# Patient Record
Sex: Male | Born: 2005
Health system: Southern US, Community
[De-identification: ages and names within clinical notes are randomized; demographics above are authoritative.]

## PROBLEM LIST (undated history)

## (undated) DIAGNOSIS — I456 Pre-excitation syndrome: Secondary | ICD-10-CM

## (undated) DIAGNOSIS — J45909 Unspecified asthma, uncomplicated: Secondary | ICD-10-CM

## (undated) DIAGNOSIS — B2790 Infectious mononucleosis, unspecified without complication: Secondary | ICD-10-CM

## (undated) HISTORY — PX: ABLATION: SHX5711

---
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
## 2023-05-28 ENCOUNTER — Emergency Department (HOSPITAL_COMMUNITY): Payer: BLUE CROSS/BLUE SHIELD

## 2023-05-28 ENCOUNTER — Emergency Department (HOSPITAL_COMMUNITY)
Admission: EM | Admit: 2023-05-28 | Discharge: 2023-05-28 | Discharge status: Against Medical Advice | Payer: BLUE CROSS/BLUE SHIELD | Attending: Emergency Medicine | Admitting: Emergency Medicine

## 2023-05-28 ENCOUNTER — Encounter (HOSPITAL_COMMUNITY): Payer: Self-pay

## 2023-05-28 ENCOUNTER — Other Ambulatory Visit: Payer: Self-pay

## 2023-05-28 DIAGNOSIS — M79602 Pain in left arm: Secondary | ICD-10-CM | POA: Diagnosis not present

## 2023-05-28 DIAGNOSIS — M546 Pain in thoracic spine: Secondary | ICD-10-CM | POA: Diagnosis not present

## 2023-05-28 DIAGNOSIS — Y9323 Activity, snow (alpine) (downhill) skiing, snow boarding, sledding, tobogganing and snow tubing: Secondary | ICD-10-CM | POA: Diagnosis not present

## 2023-05-28 DIAGNOSIS — R0781 Pleurodynia: Secondary | ICD-10-CM | POA: Diagnosis present

## 2023-05-28 DIAGNOSIS — M25522 Pain in left elbow: Secondary | ICD-10-CM | POA: Insufficient documentation

## 2023-05-28 DIAGNOSIS — Z5321 Procedure and treatment not carried out due to patient leaving prior to being seen by health care provider: Secondary | ICD-10-CM | POA: Insufficient documentation

## 2023-05-28 NOTE — ED Triage Notes (Signed)
Pt reports he was snowboarding today and fell and then someone landed on him. Pt reports positive LOC and denies headache, vision changes or emesis. Pt reports upper back/rib pain and left arm pain around elbow.

## 2023-05-29 ENCOUNTER — Emergency Department (HOSPITAL_BASED_OUTPATIENT_CLINIC_OR_DEPARTMENT_OTHER)
Admission: EM | Admit: 2023-05-29 | Discharge: 2023-05-29 | Disposition: A | Discharge status: Home / Self Care | Payer: BLUE CROSS/BLUE SHIELD | Attending: Emergency Medicine | Admitting: Emergency Medicine

## 2023-05-29 ENCOUNTER — Emergency Department (HOSPITAL_BASED_OUTPATIENT_CLINIC_OR_DEPARTMENT_OTHER): Payer: BLUE CROSS/BLUE SHIELD

## 2023-05-29 ENCOUNTER — Other Ambulatory Visit: Payer: Self-pay

## 2023-05-29 ENCOUNTER — Encounter (HOSPITAL_BASED_OUTPATIENT_CLINIC_OR_DEPARTMENT_OTHER): Payer: Self-pay | Admitting: *Deleted

## 2023-05-29 DIAGNOSIS — Y9323 Activity, snow (alpine) (downhill) skiing, snow boarding, sledding, tobogganing and snow tubing: Secondary | ICD-10-CM | POA: Diagnosis not present

## 2023-05-29 DIAGNOSIS — M7989 Other specified soft tissue disorders: Secondary | ICD-10-CM | POA: Insufficient documentation

## 2023-05-29 DIAGNOSIS — R0789 Other chest pain: Secondary | ICD-10-CM | POA: Insufficient documentation

## 2023-05-29 DIAGNOSIS — S59902A Unspecified injury of left elbow, initial encounter: Secondary | ICD-10-CM | POA: Diagnosis present

## 2023-05-29 MED ORDER — ACETAMINOPHEN 325 MG PO TABS
650.0000 mg | ORAL_TABLET | Freq: Once | ORAL | Status: AC
  Administered 2023-05-29: 650 mg via ORAL
  Filled 2023-05-29: qty 2

## 2023-05-29 MED ORDER — OXYCODONE HCL 5 MG PO TABS: 5.0000 mg | ORAL_TABLET | ORAL | 0 refills | Status: DC | PRN

## 2023-05-29 MED ORDER — IBUPROFEN 400 MG PO TABS
600.0000 mg | ORAL_TABLET | Freq: Once | ORAL | Status: AC
  Administered 2023-05-29: 600 mg via ORAL
  Filled 2023-05-29: qty 1

## 2023-05-29 NOTE — ED Provider Notes (Signed)
[place] EMERGENCY DEPARTMENT AT [facility] Provider Note   CSN: [id number] Arrival date & time: 05/29/23  0230     History  Chief Complaint  Patient presents with   Arm Injury    Ian Gutierrez is a 17 y.o. male.  The history is provided by the patient.  Arm Injury He injured his left arm 2 days ago while snowboarding.  Somebody ran into him and his left arm was pinned under him.  He also is complaining of some mild pain in the left chest wall, especially if he takes a deep breath.  He went to [facility] emergency yesterday where x-rays were taken of the chest and left elbow, but he left before being seen by provider because of the long wait.  He continues to have pain and states he is unable to sleep in spite of taking acetaminophen.   Home Medications Prior to Admission medications   Medication Sig Start Date End Date Taking? Authorizing Provider  acetaminophen (TYLENOL) 500 MG tablet Take 2 tablets (1,000 mg total) by mouth every 6 (six) hours. 09/19/21   Madison Hickman, MD  albuterol (VENTOLIN HFA) 108 (90 Base) MCG/ACT inhaler Inhale 2 puffs into the lungs every 4 (four) hours as needed for wheezing or shortness of breath. 09/19/21   Madison Hickman, MD  ibuprofen (ADVIL) 600 MG tablet Take 1 tablet (600 mg total) by mouth every 6 (six) hours as needed for fever or mild pain. 09/19/21   Madison Hickman, MD  predniSONE (DELTASONE) 20 MG tablet Take 2 tablets (40 mg total) by mouth daily with breakfast for two more doses 09/20/21   Madison Hickman, MD      Allergies    Sulfa antibiotics    Review of Systems   Review of Systems  All other systems reviewed and are negative.   Physical Exam Updated Vital Signs BP (!) 140/74   Pulse 83   Temp 98.5 F (36.9 C)   Resp 16   SpO2 100%  Physical Exam Vitals and nursing note reviewed.   17 year old male, resting comfortably and in no acute distress. Vital signs are normal. Oxygen saturation is 100%,  which is normal. Head is normocephalic and atraumatic. PERRLA, EOMI.  Neck is nontender. Back is nontender and there is no CVA tenderness. Lungs are clear without rales, wheezes, or rhonchi. Chest is nontender. Heart has regular rate and rhythm without murmur. Abdomen is soft, flat, nontender. Extremities: There is soft tissue swelling around the left elbow with tenderness diffusely but maximum tenderness in the supracondylar region.  There is mild pain on pronation and supination, moderate to severe pain with flexion.  Distal neurovascular exam is intact with strong radial pulse, prompt capillary refill, normal sensation, normal use of distal musculature.  Full range of motion all other joints without pain. Skin is warm and dry without rash. Neurologic: Mental status is normal, cranial nerves are intact, restricted movement of left arm from pain, otherwise moves all extremities equally.  ED Results / Procedures / Treatments   Labs (all labs ordered are listed, but only abnormal results are displayed) Labs Reviewed - No data to display  EKG None  Radiology CT Elbow Left Wo Contrast Result Date: 05/29/2023 CLINICAL DATA:  Elbow trauma.  Recent snowboarding accident. EXAM: CT OF THE UPPER LEFT EXTREMITY WITHOUT CONTRAST TECHNIQUE: Multidetector CT imaging of the upper left extremity was performed according to the standard protocol. RADIATION DOSE REDUCTION: This exam was performed according to the departmental dose-optimization  program which includes automated exposure control, adjustment of the mA and/or kV according to patient size and/or use of iterative reconstruction technique. COMPARISON:  Radiograph yesterday. FINDINGS: Bones/Joint/Cartilage No acute fracture. No dislocation. Joint spaces are normal. No joint effusion or lipohemarthrosis. Ligaments Suboptimally assessed by CT. Muscles and Tendons Triceps tendon is poorly defined. Heterogeneous low-density within the distal triceps  musculature. Soft tissues Posterior subcutaneous edema. No discrete fluid collection. No radiopaque foreign body or soft tissue gas. IMPRESSION: 1. No acute fracture or dislocation. 2. Triceps tendon is poorly defined. Heterogeneous low-density within the distal triceps musculature. Findings are suspicious for triceps tendon tear or rupture. This would be better assessed with MRI as clinically indicated. 3. Posterior subcutaneous edema. Electronically Signed   By: Narda Rutherford M.D.   On: 05/29/2023 03:59   DG Elbow Complete Left Result Date: 05/28/2023 CLINICAL DATA:  Larey Seat snowboarding now with right upper chest pain, left elbow injury. EXAM: LEFT ELBOW - COMPLETE 3+ VIEW; CHEST - 2 VIEW COMPARISON:  PA chest 09/18/2021.  Left elbow films 03/30/2017. FINDINGS: PA and lateral chest: The lungs are clear. The cardiomediastinal silhouette and vasculature are normal. There is no substantial pleural effusion no visible pneumothorax. Thoracic cage appears intact.  Slight thoracic dextroscoliosis. Compare: Interval resolution of the prior bilateral upper lobe infiltrates. Left elbow, 4 projections: The patient could not assume the position for a true lateral view. The presence of a joint effusion is not evaluated for. There is normal bone mineralization. No displaced fracture is seen or dislocation. No evidence of arthropathy or other focal bone abnormality. There is moderate dorsal soft tissue swelling. IMPRESSION: 1. No evidence of acute chest disease. 2. Moderate dorsal left elbow soft tissue swelling. No displaced fracture is seen. The presence of a joint effusion is not evaluated for due to positioning on the attempted lateral view. Electronically Signed   By: Almira Bar M.D.   On: 05/28/2023 02:37   DG Chest 2 View Result Date: 05/28/2023 CLINICAL DATA:  Larey Seat snowboarding now with right upper chest pain, left elbow injury. EXAM: LEFT ELBOW - COMPLETE 3+ VIEW; CHEST - 2 VIEW COMPARISON:  PA chest  09/18/2021.  Left elbow films 03/30/2017. FINDINGS: PA and lateral chest: The lungs are clear. The cardiomediastinal silhouette and vasculature are normal. There is no substantial pleural effusion no visible pneumothorax. Thoracic cage appears intact.  Slight thoracic dextroscoliosis. Compare: Interval resolution of the prior bilateral upper lobe infiltrates. Left elbow, 4 projections: The patient could not assume the position for a true lateral view. The presence of a joint effusion is not evaluated for. There is normal bone mineralization. No displaced fracture is seen or dislocation. No evidence of arthropathy or other focal bone abnormality. There is moderate dorsal soft tissue swelling. IMPRESSION: 1. No evidence of acute chest disease. 2. Moderate dorsal left elbow soft tissue swelling. No displaced fracture is seen. The presence of a joint effusion is not evaluated for due to positioning on the attempted lateral view. Electronically Signed   By: Almira Bar M.D.   On: 05/28/2023 02:37    Procedures .Ortho Injury Treatment  Date/Time: 05/29/2023 4:15 AM  Performed by: Dione Booze, MD Authorized by: Dione Booze, MD   Consent:    Consent obtained:  Verbal   Consent given by:  Patient   Risks discussed:  Restricted joint movement and stiffness   Alternatives discussed:  No treatmentInjury location: elbow Location details: left elbow Injury type: soft tissue Pre-procedure neurovascular assessment: neurovascularly intact Pre-procedure distal  perfusion: normal Pre-procedure neurological function: normal Pre-procedure range of motion: reduced  Anesthesia: Local anesthesia used: no  Patient sedated: NoImmobilization: sling Splint Applied by: ED Nurse Post-procedure distal perfusion: normal Post-procedure neurological function: normal Post-procedure range of motion: unchanged       Medications Ordered in ED Medications  ibuprofen (ADVIL) tablet 600 mg (600 mg Oral Given  05/29/23 0304)  acetaminophen (TYLENOL) tablet 650 mg (650 mg Oral Given 05/29/23 0304)    ED Course/ Medical Decision Making/ A&P                                 Medical Decision Making Amount and/or Complexity of Data Reviewed Radiology: ordered.  Risk OTC drugs. Prescription drug management.   Left elbow injury concerning for fracture.  Exam is especially concerning for possible supracondylar fracture.  X-rays obtained yesterday showed no evidence of any chest injury, significant swelling of the left elbow with no definite fracture seen.  I have independently viewed those images, and agree with radiologist's interpretation.  I am ordering CT scan of his elbow to evaluate for possible occult fracture.  CT scan shows no fracture, but suggest that there may be an injury to the triceps tendon.  I have ordered a sling, recommended he apply ice several times a day.  I have recommended that he use over-the-counter NSAIDs and acetaminophen as needed for pain, I am discharging him with a prescription for small number of oxycodone tablets to take for breakthrough pain.  I am referring him to orthopedics for follow-up.  Final Clinical Impression(s) / ED Diagnoses Final diagnoses:  Elbow injury, left, initial encounter    Rx / DC Orders ED Discharge Orders          Ordered    oxyCODONE (ROXICODONE) 5 MG immediate release tablet  Every 4 hours PRN        05/29/23 0412              Dione Booze, MD 05/29/23 [phone]

## 2023-05-29 NOTE — ED Triage Notes (Signed)
Pt had a snowboarding accident yesterday and was seen at [facility] but left due to wait time. L elbow noted toe be swollen and red, limited ROM with worsening pain.   Pt reports a snowboarder made a jump and landed on him, causing LOC, having chest pain on inspiration.

## 2023-05-29 NOTE — Discharge Instructions (Signed)
Your CT scan suggest that there may be a tear of the triceps tendon.  You will need to follow-up with the orthopedic doctor for further evaluation of this.  Call on Monday morning for an appointment.  In the meantime, wear the sling as needed.    Apply ice for 30 minutes at a time, 4 times a day.  Take ibuprofen and/or acetaminophen as needed for pain.  For pain not relieved by the combination of ibuprofen and acetaminophen, add oxycodone.

## 2024-01-23 IMAGING — DX DG CHEST 2V
3 series · 3 of 3 positions shown · non-contrast
Comparison: Outpatient radiographs of earlier today at [DATE] p.m.

CLINICAL DATA: Cough. Shortness of breath. 15 pound weight loss
over last 2 weeks. Strep throat and known pneumonia.

EXAM:
CHEST - 2 VIEW

[chest lat]
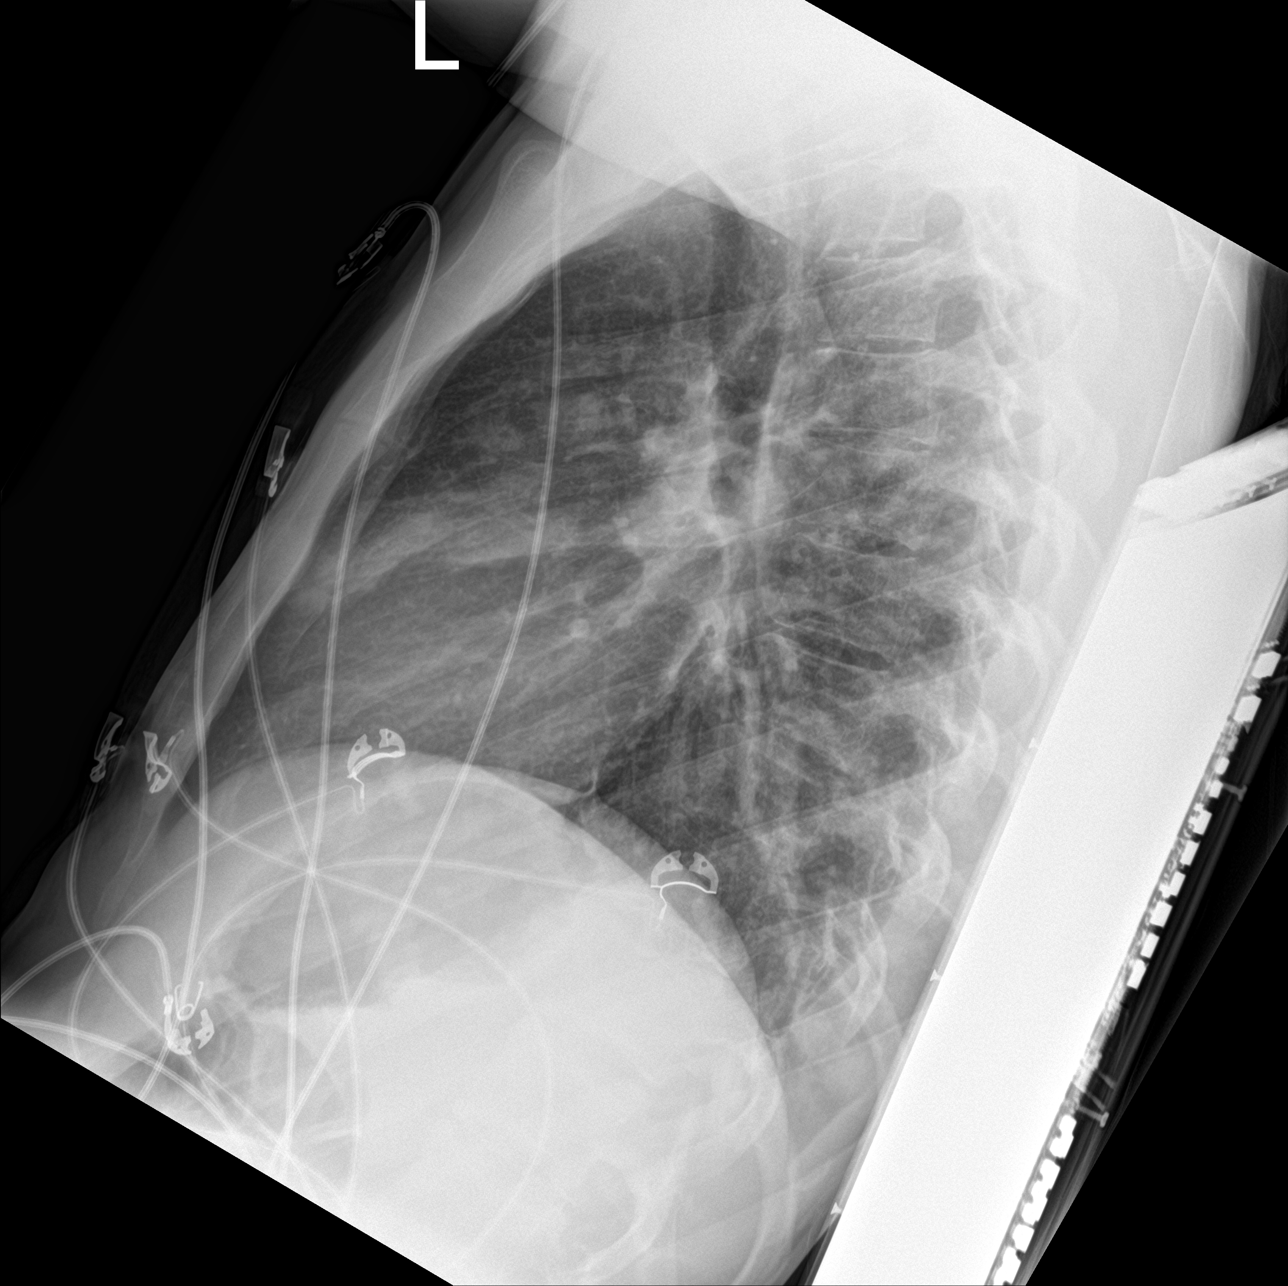

[chest ap (1 of 2)]
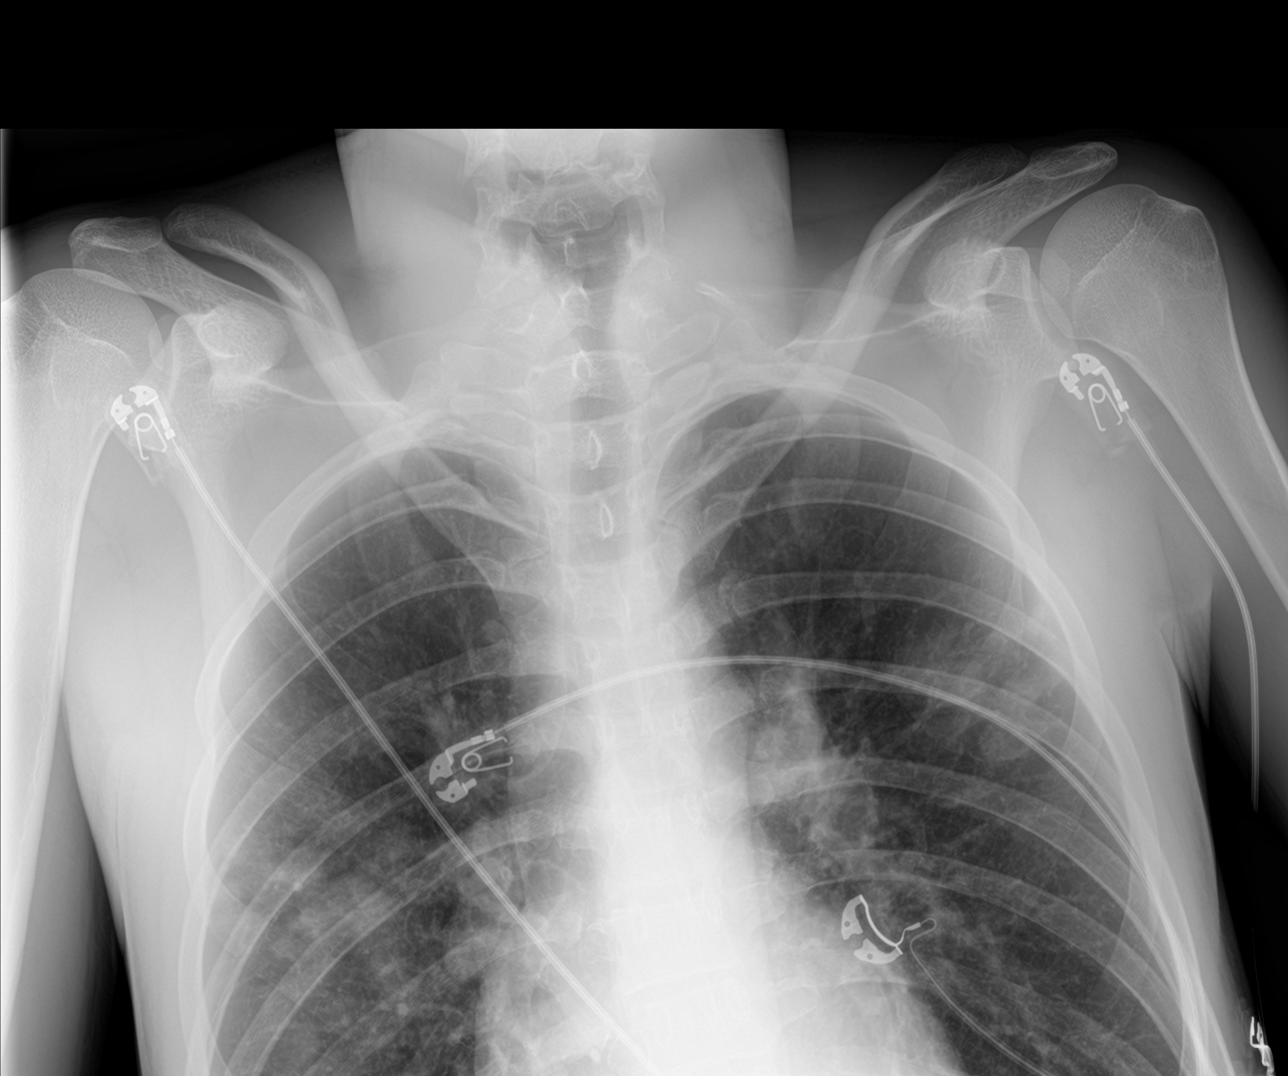

[chest ap (2 of 2)]
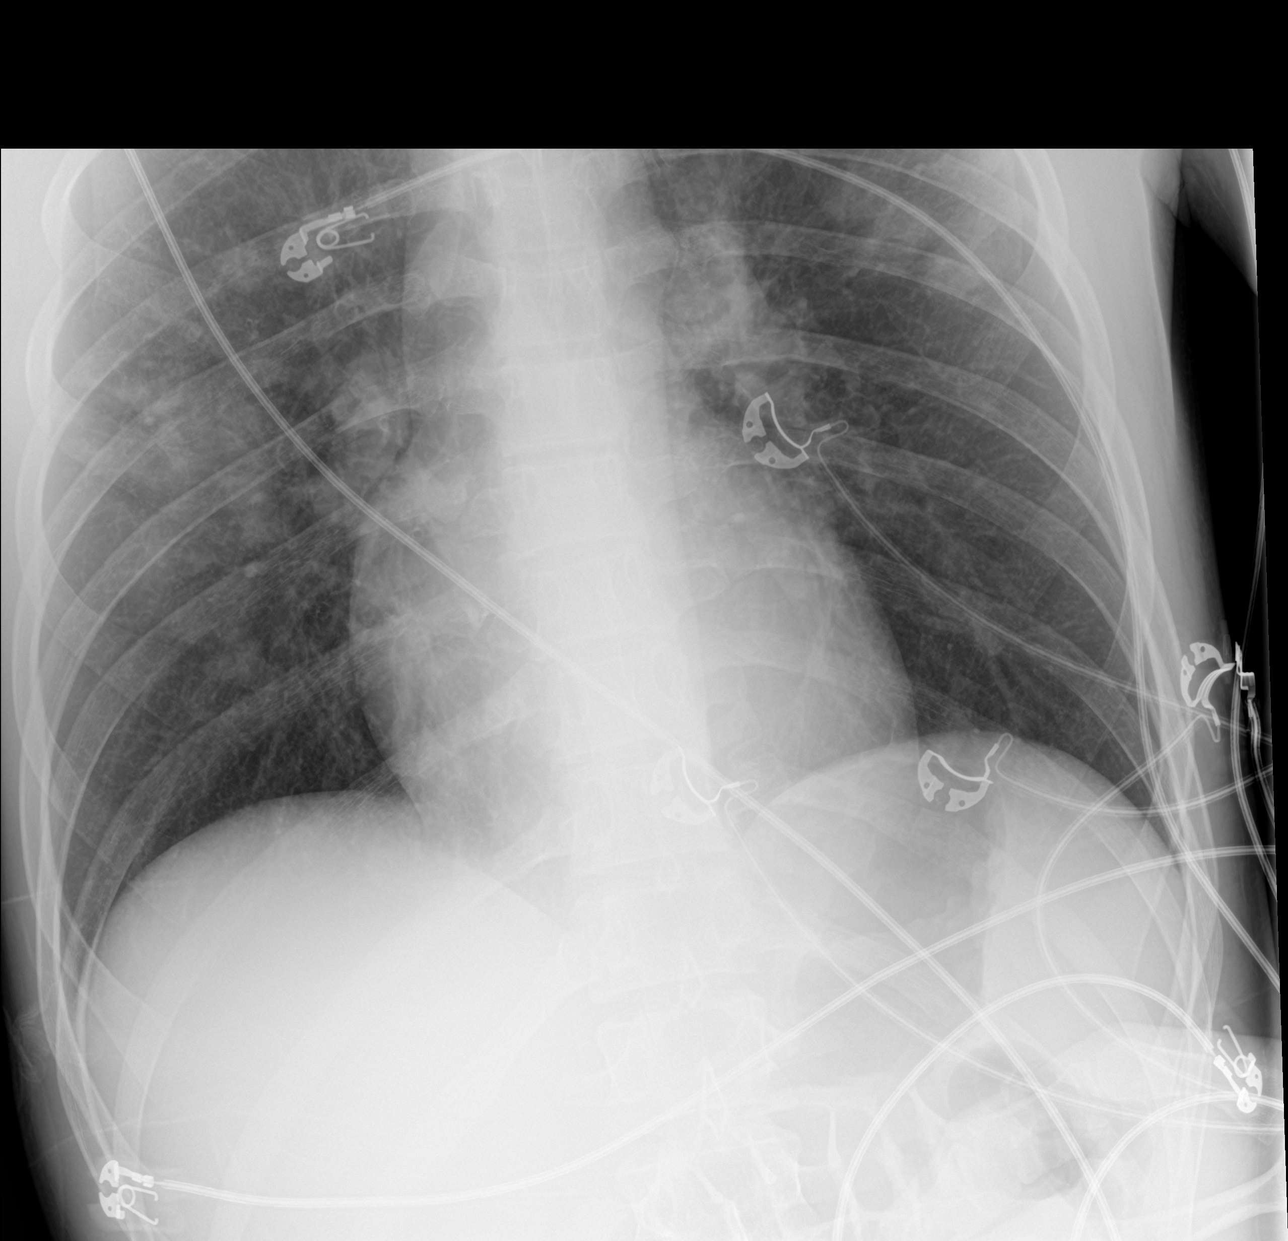

[3 of 3 positions shown; findings below may reference images not displayed]

FINDINGS: [DATE] p.m. Multiple leads and wires project over the chest on the
frontal radiograph. Midline trachea. Normal heart size and
mediastinal contours. No pleural effusion or pneumothorax. Patchy
bilateral upper lobe airspace disease is not significantly changed.
IMPRESSION: No significant change in bilateral upper lobe airspace disease, most
consistent with pneumonia.

## 2024-01-24 IMAGING — CR DG CHEST 2V SAME DAY
2 series · 2 of 2 positions shown · non-contrast
Comparison: Chest x-ray 09/17/2021.

CLINICAL DATA: Chest pain.

EXAM:
CHEST - 2 VIEW SAME DAY

[chest pa]
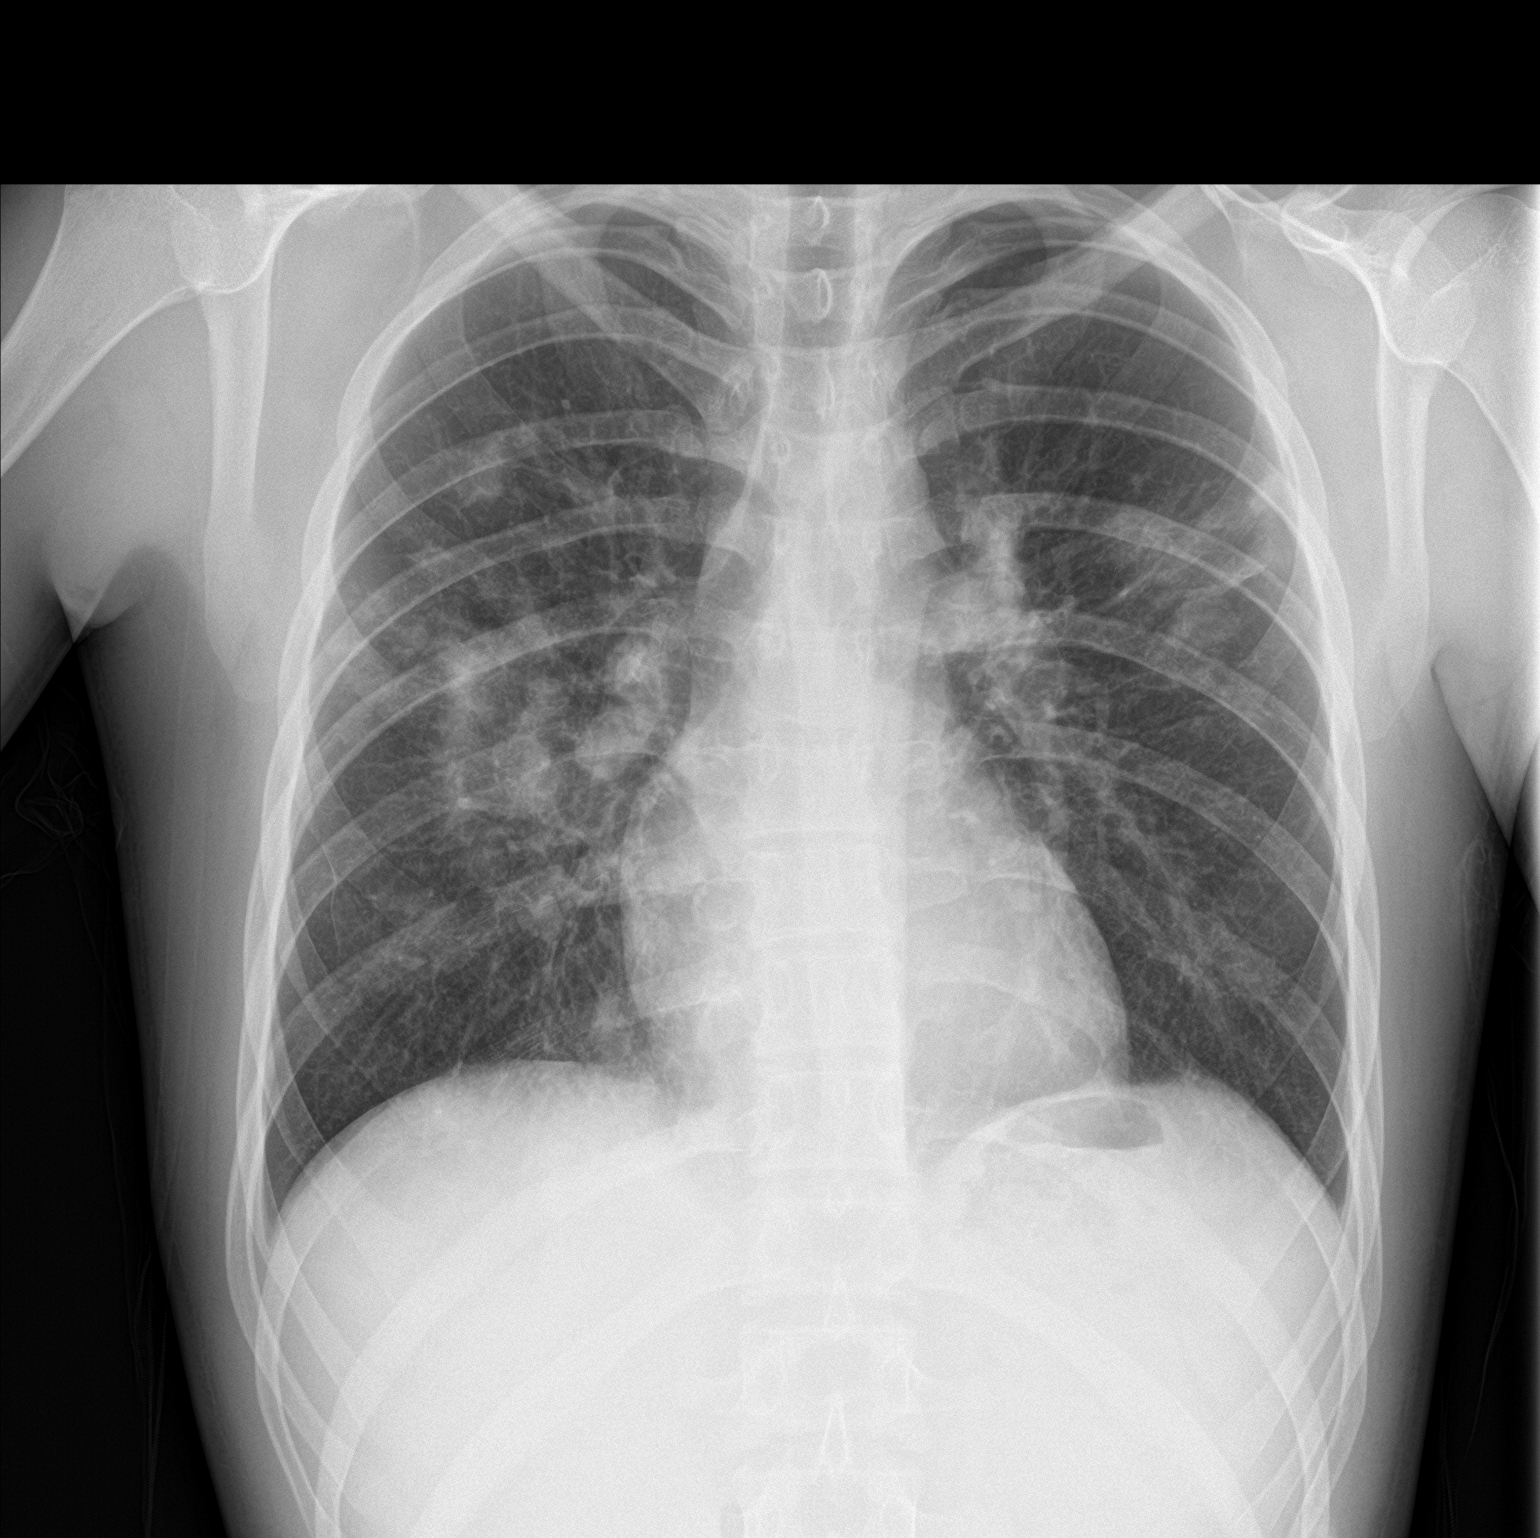

[chest lat]
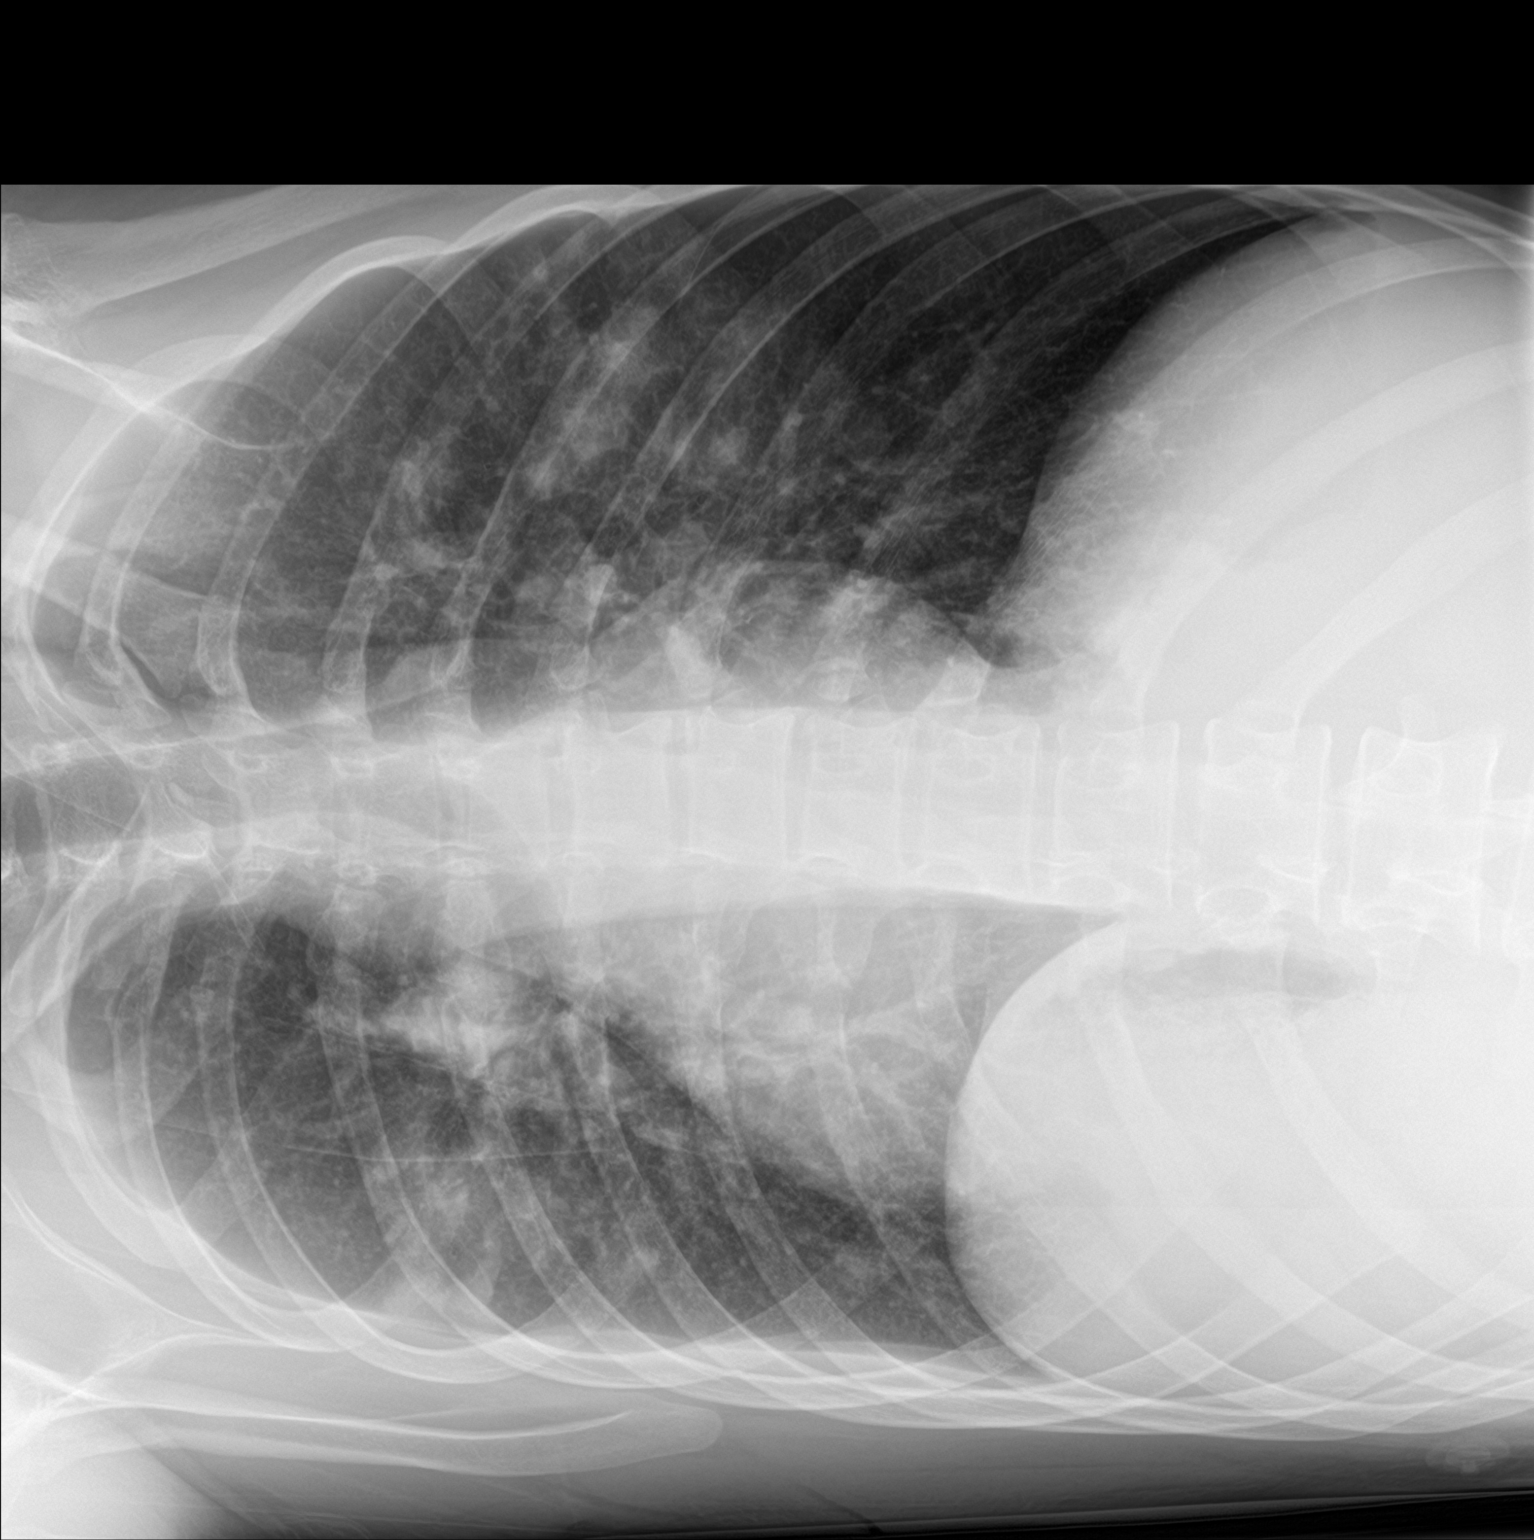

[2 of 2 positions shown; findings below may reference images not displayed]

FINDINGS: Patchy multifocal airspace opacities in the mid lungs are again
seen, mildly increased on the right. There is no pleural effusion or
pneumothorax visualized. Cardiomediastinal silhouette is within
normal limits. No acute fractures are seen.
IMPRESSION: 1. Bilateral multifocal airspace disease, increasing on the right.
Findings are worrisome for pneumonia.

## 2024-03-23 ENCOUNTER — Encounter (HOSPITAL_BASED_OUTPATIENT_CLINIC_OR_DEPARTMENT_OTHER): Payer: Self-pay | Admitting: Emergency Medicine

## 2024-03-23 ENCOUNTER — Emergency Department (HOSPITAL_BASED_OUTPATIENT_CLINIC_OR_DEPARTMENT_OTHER)
Admission: EM | Admit: 2024-03-23 | Discharge: 2024-03-23 | Disposition: A | Discharge status: Home / Self Care | Attending: Emergency Medicine | Admitting: Emergency Medicine

## 2024-03-23 ENCOUNTER — Emergency Department (HOSPITAL_BASED_OUTPATIENT_CLINIC_OR_DEPARTMENT_OTHER): Admitting: Radiology

## 2024-03-23 ENCOUNTER — Other Ambulatory Visit: Payer: Self-pay

## 2024-03-23 DIAGNOSIS — X501XXA Overexertion from prolonged static or awkward postures, initial encounter: Secondary | ICD-10-CM | POA: Diagnosis not present

## 2024-03-23 DIAGNOSIS — M25572 Pain in left ankle and joints of left foot: Secondary | ICD-10-CM | POA: Diagnosis present

## 2024-03-23 DIAGNOSIS — M25472 Effusion, left ankle: Secondary | ICD-10-CM | POA: Diagnosis not present

## 2024-03-23 NOTE — ED Provider Notes (Signed)
 [place] EMERGENCY DEPARTMENT AT [facility] Provider Note   CSN: [id number] Arrival date & time: 03/23/24  1523    Patient presents with: Ankle Pain   Ian Gutierrez is a 18 y.o. male here for evaluation of left ankle pain and swelling.  Jumped up and went down and felt a pop to the ankle.  Has had pain and swelling to the area.  Hurts in every bears weight on the area.  He was wearing a boot at home and crutches.  He denies any numbness, weakness.  No redness, lacerations.   HPI     Prior to Admission medications   Medication Sig Start Date End Date Taking? Authorizing Provider  acetaminophen  (TYLENOL ) 500 MG tablet Take 2 tablets (1,000 mg total) by mouth every 6 (six) hours. 09/19/21   Ian Iha, MD  albuterol  (VENTOLIN  HFA) [phone] Base) MCG/ACT inhaler Inhale 2 puffs into the lungs every 4 (four) hours as needed for wheezing or shortness of breath. 09/19/21   Ian Iha, MD  ibuprofen  (ADVIL ) 600 MG tablet Take 1 tablet (600 mg total) by mouth every 6 (six) hours as needed for fever or mild pain. 09/19/21   Ian Iha, MD  oxyCODONE  (ROXICODONE ) 5 MG immediate release tablet Take 1 tablet (5 mg total) by mouth every 4 (four) hours as needed for severe pain (pain score 7-10). 05/29/23   Ian Lenis, MD    Allergies: Sulfa antibiotics    Review of Systems  Constitutional: Negative.   HENT: Negative.    Respiratory: Negative.    Cardiovascular: Negative.   Gastrointestinal: Negative.   Genitourinary: Negative.   Musculoskeletal:        Left ankle pain swelling  Skin: Negative.   Neurological: Negative.   All other systems reviewed and are negative.   Updated Vital Signs BP 124/71 (BP Location: Right Arm)   Pulse 66   Temp 98.1 F (36.7 C)   Resp 20   Ht 6' 1 (1.854 m)   Wt 81.6 kg   SpO2 99%   BMI 23.75 kg/m   Physical Exam Vitals and nursing note reviewed.  Constitutional:      General: He is not in acute distress.     Appearance: He is well-developed. He is not ill-appearing, toxic-appearing or diaphoretic.  HENT:     Head: Atraumatic.  Eyes:     Pupils: Pupils are equal, round, and reactive to light.  Cardiovascular:     Rate and Rhythm: Normal rate and regular rhythm.     Pulses: Normal pulses.          Dorsalis pedis pulses are 2+ on the left side.       Posterior tibial pulses are 2+ on the left side.     Heart sounds: Normal heart sounds.  Pulmonary:     Effort: Pulmonary effort is normal. No respiratory distress.  Abdominal:     General: There is no distension.     Palpations: Abdomen is soft.  Musculoskeletal:        General: Normal range of motion.     Cervical back: Normal range of motion and neck supple.     Comments: Diffuse tenderness left lateral malleolus with overlying soft tissue swelling.  Some mild tenderness midfoot, able to plantarflex and dorsiflex.  Nontender midshaft, proximal tib-fib, knee.  Apartment soft.  Skin:    General: Skin is warm and dry.     Capillary Refill: Capillary refill takes less than 2 seconds.  Comments: Soft tissue swelling without erythema, warmth, lacerations, ecchymosis  Neurological:     Mental Status: He is alert and oriented to person, place, and time.     Sensory: No sensory deficit.     Motor: No weakness.     Gait: Gait abnormal.     (all labs ordered are listed, but only abnormal results are displayed) Labs Reviewed - No data to display  EKG: None  Radiology: DG Ankle Complete Left Result Date: 03/23/2024 CLINICAL DATA:  Swelling popping sensation EXAM: LEFT ANKLE COMPLETE - 3+ VIEW COMPARISON:  None Available. FINDINGS: No fracture or malalignment. Lateral soft tissue swelling. Ankle mortise is symmetric IMPRESSION: Soft tissue swelling without acute osseous abnormality. Electronically Signed   By: Ian Gutierrez M.D.   On: 03/23/2024 17:01     .Splint Application  Date/Time: 03/23/2024 7:00 PM  Performed by: Ian Gutierrez LABOR, PA-C Authorized by: Ian Gutierrez LABOR, PA-C   Consent:    Consent obtained:  Verbal   Consent given by:  Patient   Risks, benefits, and alternatives were discussed: yes     Risks discussed:  Discoloration, numbness, pain and swelling   Alternatives discussed:  No treatment, delayed treatment, alternative treatment, observation and referral Universal protocol:    Procedure explained and questions answered to patient or proxy's satisfaction: yes     Relevant documents present and verified: yes     Test results available: yes     Imaging studies available: yes     Required blood products, implants, devices, and special equipment available: yes     Site/side marked: yes     Immediately prior to procedure a time out was called: yes     Patient identity confirmed:  Verbally with patient Pre-procedure details:    Distal neurologic exam:  Normal   Distal perfusion: distal pulses strong and brisk capillary refill   Procedure details:    Location:  Ankle   Ankle location:  L ankle   Cast type:  Short leg   Splint type:  Ankle stirrup   Supplies:  Prefabricated splint   Attestation: Splint applied and adjusted personally by me   Post-procedure details:    Distal neurologic exam:  Normal   Distal perfusion: distal pulses strong and brisk capillary refill     Procedure completion:  Tolerated well, no immediate complications   Post-procedure imaging: not applicable      Medications Ordered in the ED - No data to display  18 year old here for evaluation of left ankle pain and swelling after landing and feeling a pop to his left lateral malleolus.  Pain radiates into his foot.  Difficulty when he bears weight.  Here he is neurovascularly intact.  Nontender midshaft, proximal tib-fib.  Able to plantarflex and dorsiflex.  He does have some pain to his midfoot.  His compartments are soft.  He has no evidence of compartment syndrome.  No open lesions.  He comes in crutches from home.  He had an  x-ray left ankle from triage.  I recommend x-ray left foot given his tenderness.  Patient does not want additional imaging at this time.  Discussed risk versus benefit.  Voiced understanding.  He states he will follow-up with Beverley and Jacklin whom he has seen previously  Imaging personally viewed and interpreted:  X-ray left ankle without fracture, dislocation  Patient placed in an Aircast.  He has crutches at home.  Discussed RICE for symptomatic management.  Will follow-up with orthopedist whom he has seen  previously.  Will return for new or worsening symptoms.  The patient has been appropriately medically screened and/or stabilized in the ED. I have low suspicion for any other emergent medical condition which would require further screening, evaluation or treatment in the ED or require inpatient management.  Patient is hemodynamically stable and in no acute distress.  Patient able to ambulate in department prior to ED.  Evaluation does not show acute pathology that would require ongoing or additional emergent interventions while in the emergency department or further inpatient treatment.  I have discussed the diagnosis with the patient and answered all questions.  Pain is been managed while in the emergency department and patient has no further complaints prior to discharge.  Patient is comfortable with plan discussed in room and is stable for discharge at this time.  I have discussed strict return precautions for returning to the emergency department.  Patient was encouraged to follow-up with PCP/specialist refer to at discharge.                                     Medical Decision Making Amount and/or Complexity of Data Reviewed Independent Historian: friend External Data Reviewed: labs, radiology and notes. Radiology: ordered and independent interpretation performed. Decision-making details documented in ED Course.  Risk OTC drugs. Decision regarding hospitalization. Diagnosis or  treatment significantly limited by social determinants of health.       Final diagnoses:  Acute left ankle pain    ED Discharge Orders     None          Lynnet Hefley A, PA-C 03/23/24 1901    Ruthe Cornet, DO 03/23/24 2133

## 2024-03-23 NOTE — ED Triage Notes (Signed)
 Pt via pov from home with left ankle pain. He jumped and felt a pop as he came down. Ankle is swollen and bulbous. Pt states he is unable to bear weight on the ankle and arrived with his own crutches. Pt a&o x 4; nad noted.

## 2024-03-23 NOTE — Discharge Instructions (Signed)
 It was a pleasure taking care of you here today  As we discussed there were no broken bones in your x-ray of your ankle.  I did discuss an x-ray of your foot which she declined.  We have placed you in the cam boot.  I would make sure to ice, elevate the ankle and foot.  I would take Tylenol  Motrin  over the next few days.  Follow-up with your orthopedist, use your crutches.  Return for any new or worsening symptoms

## 2024-05-04 ENCOUNTER — Other Ambulatory Visit: Payer: Self-pay

## 2024-05-04 ENCOUNTER — Ambulatory Visit
Admission: EM | Admit: 2024-05-04 | Discharge: 2024-05-04 | Disposition: A | Discharge status: Home / Self Care | Attending: Emergency Medicine | Admitting: Emergency Medicine

## 2024-05-04 DIAGNOSIS — H109 Unspecified conjunctivitis: Secondary | ICD-10-CM | POA: Insufficient documentation

## 2024-05-04 DIAGNOSIS — J029 Acute pharyngitis, unspecified: Secondary | ICD-10-CM | POA: Insufficient documentation

## 2024-05-04 DIAGNOSIS — B9689 Other specified bacterial agents as the cause of diseases classified elsewhere: Secondary | ICD-10-CM | POA: Diagnosis not present

## 2024-05-04 DIAGNOSIS — R509 Fever, unspecified: Secondary | ICD-10-CM | POA: Insufficient documentation

## 2024-05-04 LAB — POC COVID19/FLU A&B COMBO
Covid Antigen, POC: NEGATIVE
Influenza A Antigen, POC: NEGATIVE
Influenza B Antigen, POC: NEGATIVE

## 2024-05-04 LAB — POCT RAPID STREP A (OFFICE): Rapid Strep A Screen: NEGATIVE

## 2024-05-04 MED ORDER — ERYTHROMYCIN 5 MG/GM OP OINT: TOPICAL_OINTMENT | OPHTHALMIC | 0 refills | Status: AC

## 2024-05-04 NOTE — ED Triage Notes (Signed)
 Pt presents with c/o of sore throat and fevers x 3-4 days. Does endorse sick contacts. Also states he has been experiencing bilateral eye redness with drainage. Noticed this yesterday. Currently rates overall throat pain a 5/10. Has been taking OTC Tylenol  for symptoms with no improvement.

## 2024-05-04 NOTE — Discharge Instructions (Addendum)
 COVID and flu testing were negative.  Please alternate between 600 mg of ibuprofen  and 500 mg of Tylenol  every 4-6 hours to help with fever, body aches and chills.  Warm saline gargles, tea with honey and over-the-counter cough drops can help with sore throat.  Strep testing was negative, will resend this out for culture and we will contact you if antibiotics are needed.  Avoid touching your eyes.  Wash your hands before and after antibiotic application.  You can clean them with a warm rag to help remove any drainage.  Symptoms should improve over the next few days.  If no improvement or any changes return to clinic for reevaluation.

## 2024-05-04 NOTE — ED Provider Notes (Signed)
 [facility]    CSN: [id number] Arrival date & time: 05/04/24  1057      History   Chief Complaint Chief Complaint  Patient presents with   Sore Throat   Fever    HPI Ian Gutierrez is a 18 y.o. male.   Patient presents to clinic over concern of sore throat and fever for the past 3 to 4 days.  Has had nasal congestion and a mild cough.  Is concerned because yesterday he noticed his eyes were red and he woke up this morning with his eyes crusted shut.  They are quite itchy. Has tried Tylenol  without much improvement.   Denies vision changes. Some recent sick contacts.  Does not wear contacts.  The history is provided by medical records and the patient.  Sore Throat  Fever   Past Medical History:  Diagnosis Date   Asthma    Mononucleosis    Wolff-Parkinson-White (WPW) pattern     Patient Active Problem List   Diagnosis Date Noted   Pneumonia 09/17/2021   Strep pharyngitis 09/17/2021   Pleuritic chest pain 09/17/2021   Costochondritis 09/17/2021    Past Surgical History:  Procedure Laterality Date   ABLATION         Home Medications    Prior to Admission medications   Medication Sig Start Date End Date Taking? Authorizing Provider  erythromycin ophthalmic ointment Place a 1/2 inch ribbon of ointment into the lower eyelid of both eyes 4x daily for 5 days 05/04/24  Yes Dreama, Reyanne Hussar  N, FNP  albuterol  (VENTOLIN  HFA) 108 (90 Base) MCG/ACT inhaler Inhale 2 puffs into the lungs every 4 (four) hours as needed for wheezing or shortness of breath. 09/19/21   Sharlot Iha, MD    Family History Family History  Problem Relation Age of Onset   Healthy Mother    Healthy Father     Social History Social History   Tobacco Use   Smoking status: Never    Passive exposure: Never   Smokeless tobacco: Never  Vaping Use   Vaping status: Never Used  Substance Use Topics   Alcohol  use: Yes    Alcohol /week: 1.0 standard drink of alcohol      Types: 1 Standard drinks or equivalent per week    Comment: Drinks approximately 2x per month, 3-4 drinks per session   Drug use: Never     Allergies   Sulfa antibiotics   Review of Systems Review of Systems  Per HPI  Physical Exam Triage Vital Signs ED Triage Vitals  Encounter Vitals Group     BP 05/04/24 1119 112/69     Girls Systolic BP Percentile --      Girls Diastolic BP Percentile --      Boys Systolic BP Percentile --      Boys Diastolic BP Percentile --      Pulse Rate 05/04/24 1119 88     Resp 05/04/24 1119 15     Temp 05/04/24 1119 98.4 F (36.9 C)     Temp Source 05/04/24 1119 Oral     SpO2 05/04/24 1119 95 %     Weight 05/04/24 1127 179 lb 14.3 oz (81.6 kg)     Height 05/04/24 1127 6' 1 (1.854 m)     Head Circumference --      Peak Flow --      Pain Score 05/04/24 1126 5     Pain Loc --      Pain Education --  Exclude from Growth Chart --    No data found.  Updated Vital Signs BP 112/69 (BP Location: Right Arm)   Pulse 88   Temp 98.4 F (36.9 C) (Oral)   Resp 15   Ht 6' 1 (1.854 m)   Wt 179 lb 14.3 oz (81.6 kg)   SpO2 95%   BMI 23.73 kg/m   Visual Acuity Right Eye Distance:   Left Eye Distance:   Bilateral Distance:    Right Eye Near:   Left Eye Near:    Bilateral Near:     Physical Exam Vitals and nursing note reviewed.  Constitutional:      Appearance: Normal appearance. He is well-developed.  HENT:     Head: Normocephalic and atraumatic.     Right Ear: External ear normal.     Left Ear: External ear normal.     Nose: Congestion present.     Mouth/Throat:     Mouth: Mucous membranes are moist.     Pharynx: Posterior oropharyngeal erythema present.     Tonsils: No tonsillar exudate or tonsillar abscesses. 2+ on the right. 2+ on the left.  Eyes:     General:        Right eye: Discharge present.        Left eye: Discharge present. Cardiovascular:     Rate and Rhythm: Normal rate and regular rhythm.     Heart sounds:  Normal heart sounds. No murmur heard. Pulmonary:     Effort: Pulmonary effort is normal. No respiratory distress.     Breath sounds: Normal breath sounds. No wheezing.  Skin:    General: Skin is warm and dry.  Neurological:     General: No focal deficit present.     Mental Status: He is alert and oriented to person, place, and time.  Psychiatric:        Mood and Affect: Mood normal.        Behavior: Behavior normal. Behavior is cooperative.      UC Treatments / Results  Labs (all labs ordered are listed, but only abnormal results are displayed) Labs Reviewed  CULTURE, GROUP A STREP ([facility])  POC COVID19/FLU A&B COMBO  POCT RAPID STREP A (OFFICE)    EKG   Radiology No results found.  Procedures Procedures (including critical care time)  Medications Ordered in UC Medications - No data to display  Initial Impression / Assessment and Plan / UC Course  I have reviewed the triage vital signs and the nursing notes.  Pertinent labs & imaging results that were available during my care of the patient were reviewed by me and considered in my medical decision making (see chart for details).  Vitals and triage reviewed, patient is hemodynamically stable.  Lungs vesicular, heart with regular rate and rhythm.  Mild congestion.  Tonsils with 2+ swelling bilaterally, uvula midline.  Without exudate.  POC rapid strep negative, will send for culture.  Bilateral conjunctival with injection, patient reports mucopurulent discharge and crusting shut.  Presentation consistent with bacterial conjunctivitis, will treat with erythromycin ointment.  Does not wear contacts.  Symptomatic management for viral illness discussed.  Plan of care, follow-up care return precautions given, no questions at this time.    Final Clinical Impressions(s) / UC Diagnoses   Final diagnoses:  Fever, unspecified  Acute pharyngitis, unspecified etiology  Bacterial conjunctivitis of both eyes     Discharge  Instructions      COVID and flu testing were negative.  Please alternate between 600  mg of ibuprofen  and 500 mg of Tylenol  every 4-6 hours to help with fever, body aches and chills.  Warm saline gargles, tea with honey and over-the-counter cough drops can help with sore throat.  Strep testing was negative, will resend this out for culture and we will contact you if antibiotics are needed.  Avoid touching your eyes.  Wash your hands before and after antibiotic application.  You can clean them with a warm rag to help remove any drainage.  Symptoms should improve over the next few days.  If no improvement or any changes return to clinic for reevaluation.      ED Prescriptions     Medication Sig Dispense Auth. Provider   erythromycin ophthalmic ointment Place a 1/2 inch ribbon of ointment into the lower eyelid of both eyes 4x daily for 5 days 3.5 g Dreama, Aldrick Derrig  N, FNP      PDMP not reviewed this encounter.   Dreama, Imberly Troxler  N, FNP 05/04/24 1216

## 2024-05-07 LAB — CULTURE, GROUP A STREP (THRC)

## 2024-05-08 ENCOUNTER — Ambulatory Visit (HOSPITAL_COMMUNITY): Payer: Self-pay

## 2024-05-27 ENCOUNTER — Encounter (HOSPITAL_COMMUNITY): Payer: Self-pay

## 2024-05-27 ENCOUNTER — Ambulatory Visit (HOSPITAL_COMMUNITY)
Admission: RE | Admit: 2024-05-27 | Discharge: 2024-05-27 | Disposition: A | Discharge status: Home / Self Care | Source: Ambulatory Visit | Attending: Emergency Medicine | Admitting: Emergency Medicine

## 2024-05-27 ENCOUNTER — Ambulatory Visit (HOSPITAL_COMMUNITY)

## 2024-05-27 VITALS — BP 115/63 | HR 95 | Temp 98.4°F | Resp 16

## 2024-05-27 DIAGNOSIS — B349 Viral infection, unspecified: Secondary | ICD-10-CM

## 2024-05-27 DIAGNOSIS — R051 Acute cough: Secondary | ICD-10-CM | POA: Diagnosis not present

## 2024-05-27 DIAGNOSIS — R197 Diarrhea, unspecified: Secondary | ICD-10-CM

## 2024-05-27 DIAGNOSIS — R112 Nausea with vomiting, unspecified: Secondary | ICD-10-CM | POA: Diagnosis not present

## 2024-05-27 MED ORDER — ONDANSETRON 4 MG PO TBDP: 4.0000 mg | ORAL_TABLET | Freq: Three times a day (TID) | ORAL | 0 refills | Status: AC | PRN

## 2024-05-27 MED ORDER — BENZONATATE 100 MG PO CAPS: 100.0000 mg | ORAL_CAPSULE | Freq: Three times a day (TID) | ORAL | 0 refills | Status: AC

## 2024-05-27 MED ORDER — PROMETHAZINE-DM 6.25-15 MG/5ML PO SYRP: 5.0000 mL | ORAL_SOLUTION | Freq: Every evening | ORAL | 0 refills | Status: AC | PRN

## 2024-05-27 NOTE — ED Triage Notes (Signed)
 Patient has had cough, nasal congestion, body aches, vomiting ,and diarrhea for 6 days.  Patient stated his stomach has been vibrating on the right side and it started 4 days ago. Patient has been taking tylenol  and other medication for flu like symptoms

## 2024-05-27 NOTE — Discharge Instructions (Signed)
 Based on my interpretation your x-ray does not reveal any underlying pneumonia.  If radiology report suggest differently someone will call to let you know and advise additional treatment at that time. As discussed I believe your symptoms are likely related to a viral illness. You can take Tessalon  every 8 hours as needed for cough. You can take Promethazine  DM cough syrup at bedtime as needed for cough.  This can make you drowsy so do not drive, work, or drink alcohol  while taking this. You can take Zofran  every 8 hours as needed for nausea and vomiting.  This will dissolve under your tongue. I also recommend over-the-counter Mucinex for cough and congestion as well. Alternate between Tylenol  and ibuprofen  as needed for any pain or fever. Make sure you are staying hydrated and getting plenty of rest. If you develop worsening abdominal pain, persistent excessive vomiting or diarrhea, blood in vomit or stool, persistent fevers, or severe weakness please seek immediate medical treatment in the emergency department.

## 2024-05-27 NOTE — ED Provider Notes (Signed)
 " [facility]    CSN: [id number] Arrival date & time: 05/27/24  1642      History   Chief Complaint Chief Complaint  Patient presents with   Cough   Nasal Congestion    HPI Ian Gutierrez is a 18 y.o. male.   Patient presents with cough, nasal congestion, body aches, and chills that began 6 days ago.  Patient reports that he has also had intermittent fever over the last few days and states that he had a fever earlier today.  Patient states that his cough seems to have worsened over the last few days and he does occasionally have some chest tightness mainly with cough.  Patient also states that he has been having intermittent vomiting and diarrhea over the last 6 days.  Patient states that 4 days ago he began to have some right sided muscle spasms in his abdomen.  Patient states that these are not constant and he does not have constant abdominal pain and they will occur randomly.  Patient denies any blood in vomit or stool.  Patient reports that he has been taking Tylenol  and over-the-counter medications for symptoms with some relief.  Patient reports that he last vomited this morning, but does continue to have some mild nausea and diarrhea.  The history is provided by the patient and medical records.  Cough   Past Medical History:  Diagnosis Date   Asthma    Mononucleosis    Wolff-Parkinson-White (WPW) pattern     Patient Active Problem List   Diagnosis Date Noted   Pneumonia 09/17/2021   Strep pharyngitis 09/17/2021   Pleuritic chest pain 09/17/2021   Costochondritis 09/17/2021    Past Surgical History:  Procedure Laterality Date   ABLATION         Home Medications    Prior to Admission medications  Medication Sig Start Date End Date Taking? Authorizing Provider  benzonatate  (TESSALON ) 100 MG capsule Take 1 capsule (100 mg total) by mouth every 8 (eight) hours. 05/27/24  Yes Johnie, Hooria Gasparini A, NP  ondansetron  (ZOFRAN -ODT) 4 MG disintegrating  tablet Take 1 tablet (4 mg total) by mouth every 8 (eight) hours as needed for nausea or vomiting. 05/27/24  Yes Johnie Flaming A, NP  promethazine -dextromethorphan (PROMETHAZINE -DM) 6.25-15 MG/5ML syrup Take 5 mLs by mouth at bedtime as needed for cough. 05/27/24  Yes Johnie Flaming A, NP  albuterol  (VENTOLIN  HFA) 108 (90 Base) MCG/ACT inhaler Inhale 2 puffs into the lungs every 4 (four) hours as needed for wheezing or shortness of breath. 09/19/21   Sharlot Iha, MD  erythromycin  ophthalmic ointment Place a 1/2 inch ribbon of ointment into the lower eyelid of both eyes 4x daily for 5 days 05/04/24   Dreama, [place]  N, FNP    Family History Family History  Problem Relation Age of Onset   Healthy Mother    Healthy Father     Social History Social History[1]   Allergies   Sulfa antibiotics   Review of Systems Review of Systems  Respiratory:  Positive for cough.    Per HPI  Physical Exam Triage Vital Signs ED Triage Vitals [05/27/24 1655]  Encounter Vitals Group     BP 115/63     Girls Systolic BP Percentile      Girls Diastolic BP Percentile      Boys Systolic BP Percentile      Boys Diastolic BP Percentile      Pulse Rate 95     Resp 16  Temp 98.4 F (36.9 C)     Temp Source Oral     SpO2 98 %     Weight      Height      Head Circumference      Peak Flow      Pain Score 3     Pain Loc      Pain Education      Exclude from Growth Chart    No data found.  Updated Vital Signs BP 115/63 (BP Location: Right Arm)   Pulse 95   Temp 98.4 F (36.9 C) (Oral)   Resp 16   SpO2 98%   Visual Acuity Right Eye Distance:   Left Eye Distance:   Bilateral Distance:    Right Eye Near:   Left Eye Near:    Bilateral Near:     Physical Exam Vitals and nursing note reviewed.  Constitutional:      General: He is awake. He is not in acute distress.    Appearance: Normal appearance. He is well-developed and well-groomed. He is not ill-appearing.  HENT:      Right Ear: Tympanic membrane, ear canal and external ear normal.     Left Ear: Tympanic membrane, ear canal and external ear normal.     Nose: Congestion and rhinorrhea present.     Mouth/Throat:     Mouth: Mucous membranes are moist.     Pharynx: Posterior oropharyngeal erythema and postnasal drip present. No oropharyngeal exudate.  Cardiovascular:     Rate and Rhythm: Normal rate and regular rhythm.  Pulmonary:     Effort: Pulmonary effort is normal.     Breath sounds: Normal breath sounds.  Abdominal:     General: Abdomen is flat. Bowel sounds are normal. There is no distension.     Palpations: Abdomen is soft. There is no mass.     Tenderness: There is no abdominal tenderness. There is no guarding or rebound.     Hernia: No hernia is present.  Skin:    General: Skin is warm and dry.  Neurological:     General: No focal deficit present.     Mental Status: He is alert and oriented to person, place, and time. Mental status is at baseline.  Psychiatric:        Behavior: Behavior is cooperative.      UC Treatments / Results  Labs (all labs ordered are listed, but only abnormal results are displayed) Labs Reviewed - No data to display  EKG   Radiology No results found.  Procedures Procedures (including critical care time)  Medications Ordered in UC Medications - No data to display  Initial Impression / Assessment and Plan / UC Course  I have reviewed the triage vital signs and the nursing notes.  Pertinent labs & imaging results that were available during my care of the patient were reviewed by me and considered in my medical decision making (see chart for details).     Patient is overall well-appearing.  Vitals are stable.  Deferred COVID and flu testing due to duration of symptoms.  Ordered chest x-ray due to presence of chest tightness with coughing.  I independently interpreted these images and there appears to be no active cardiopulmonary disease at this time.   Radiology report confirms this.  Symptoms likely viral in nature.  Prescribed Tessalon  and Promethazine  DM as needed for cough.  Prescribe Zofran  as needed for nausea and vomiting.  Discussed over-the-counter medications for symptoms.  Discussed follow-up, return,  and strict ER precautions. Final Clinical Impressions(s) / UC Diagnoses   Final diagnoses:  Acute cough  Nausea, vomiting, and diarrhea  Viral syndrome     Discharge Instructions      Based on my interpretation your x-ray does not reveal any underlying pneumonia.  If radiology report suggest differently someone will call to let you know and advise additional treatment at that time. As discussed I believe your symptoms are likely related to a viral illness. You can take Tessalon  every 8 hours as needed for cough. You can take Promethazine  DM cough syrup at bedtime as needed for cough.  This can make you drowsy so do not drive, work, or drink alcohol  while taking this. You can take Zofran  every 8 hours as needed for nausea and vomiting.  This will dissolve under your tongue. I also recommend over-the-counter Mucinex for cough and congestion as well. Alternate between Tylenol  and ibuprofen  as needed for any pain or fever. Make sure you are staying hydrated and getting plenty of rest. If you develop worsening abdominal pain, persistent excessive vomiting or diarrhea, blood in vomit or stool, persistent fevers, or severe weakness please seek immediate medical treatment in the emergency department.     ED Prescriptions     Medication Sig Dispense Auth. Provider   ondansetron  (ZOFRAN -ODT) 4 MG disintegrating tablet Take 1 tablet (4 mg total) by mouth every 8 (eight) hours as needed for nausea or vomiting. 10 tablet Johnie Flaming A, NP   benzonatate  (TESSALON ) 100 MG capsule Take 1 capsule (100 mg total) by mouth every 8 (eight) hours. 21 capsule Johnie Flaming A, NP   promethazine -dextromethorphan (PROMETHAZINE -DM) 6.25-15  MG/5ML syrup Take 5 mLs by mouth at bedtime as needed for cough. 118 mL Johnie Flaming A, NP      PDMP not reviewed this encounter.     [1]  Social History Tobacco Use   Smoking status: Never    Passive exposure: Never   Smokeless tobacco: Never  Vaping Use   Vaping status: Never Used  Substance Use Topics   Alcohol  use: Yes    Alcohol /week: 1.0 standard drink of alcohol     Types: 1 Standard drinks or equivalent per week    Comment: Drinks approximately 2x per month, 3-4 drinks per session   Drug use: Never     Johnie Flaming LABOR, NP 05/27/24 1929  "

## 2024-05-29 ENCOUNTER — Ambulatory Visit (HOSPITAL_COMMUNITY): Payer: Self-pay
